# Patient Record
Sex: Male | Born: 1990 | Hispanic: No | Marital: Single | Smoking: Former smoker
Health system: Southern US, Community
[De-identification: ages and names within clinical notes are randomized; demographics above are authoritative.]

## PROBLEM LIST (undated history)

## (undated) DIAGNOSIS — R000 Tachycardia, unspecified: Secondary | ICD-10-CM

---

## 2012-03-05 ENCOUNTER — Other Ambulatory Visit (HOSPITAL_COMMUNITY): Payer: Self-pay | Admitting: Internal Medicine

## 2012-03-05 ENCOUNTER — Ambulatory Visit (HOSPITAL_COMMUNITY)
Admission: RE | Admit: 2012-03-05 | Discharge: 2012-03-05 | Disposition: A | Discharge status: Home / Self Care | Payer: Medicaid Other | Source: Ambulatory Visit | Attending: Internal Medicine | Admitting: Internal Medicine

## 2012-03-05 DIAGNOSIS — R52 Pain, unspecified: Secondary | ICD-10-CM

## 2012-03-05 DIAGNOSIS — R079 Chest pain, unspecified: Secondary | ICD-10-CM | POA: Insufficient documentation

## 2012-03-05 DIAGNOSIS — M954 Acquired deformity of chest and rib: Secondary | ICD-10-CM | POA: Insufficient documentation

## 2016-02-21 ENCOUNTER — Ambulatory Visit (HOSPITAL_COMMUNITY)
Admission: EM | Admit: 2016-02-21 | Discharge: 2016-02-21 | Disposition: A | Discharge status: Home / Self Care | Payer: Medicaid Other | Attending: Family Medicine | Admitting: Family Medicine

## 2016-02-21 ENCOUNTER — Encounter (HOSPITAL_COMMUNITY): Payer: Self-pay | Admitting: Emergency Medicine

## 2016-02-21 DIAGNOSIS — S76211A Strain of adductor muscle, fascia and tendon of right thigh, initial encounter: Secondary | ICD-10-CM

## 2016-02-21 HISTORY — DX: Tachycardia, unspecified: R00.0

## 2016-02-21 NOTE — ED Triage Notes (Signed)
Pt has been having some right groin pain for a few days.  Today he noticed some swelling in his right upper leg in the groin area.  He states he does a lot of lifting for work.  Pt reports a low grade fever of 100 for the last month.  He also reports constipation that is relieved with OTC stool softener.

## 2016-02-21 NOTE — ED Provider Notes (Signed)
[facility]    CSN: [id number] Arrival date & time: 02/21/16  1352     History   Chief Complaint Chief Complaint  Patient presents with  . Groin Pain    HPI Gregory Calderon is a 26 y.o. male.   The history is provided by the patient.  Groin Pain  This is a new problem. The current episode started 2 days ago. The problem has not changed since onset.Pertinent negatives include no chest pain and no abdominal pain.    Past Medical History:  Diagnosis Date  . Tachycardia     There are no active problems to display for this patient.   History reviewed. No pertinent surgical history.     Home Medications    Prior to Admission medications   Not on File    Family History History reviewed. No pertinent family history.  Social History Social History  Substance Use Topics  . Smoking status: Current Some Day Smoker  . Smokeless tobacco: Never Used  . Alcohol use No     Allergies   Patient has no known allergies.   Review of Systems Review of Systems  Constitutional: Negative.   Cardiovascular: Negative for chest pain.  Gastrointestinal: Positive for constipation and rectal pain. Negative for abdominal distention, abdominal pain, nausea and vomiting.  Genitourinary: Negative.   Musculoskeletal: Negative.   All other systems reviewed and are negative.    Physical Exam Triage Vital Signs ED Triage Vitals [02/21/16 1508]  Enc Vitals Group     BP 129/78     Pulse Rate 79     Resp      Temp 98.2 F (36.8 C)     Temp Source Oral     SpO2 100 %     Weight      Height      Head Circumference      Peak Flow      Pain Score 1     Pain Loc      Pain Edu?      Excl. in GC?    No data found.   Updated Vital Signs BP 129/78 (BP Location: Left Arm)   Pulse 79   Temp 98.2 F (36.8 C) (Oral)   SpO2 100%   Visual Acuity Right Eye Distance:   Left Eye Distance:   Bilateral Distance:    Right Eye Near:   Left Eye Near:    Bilateral  Near:     Physical Exam  Constitutional: He appears well-developed and well-nourished. No distress.  Abdominal: Soft. Bowel sounds are normal. He exhibits no distension and no mass. There is no tenderness. There is no rebound and no guarding. No hernia.    Skin: Skin is warm.  Nursing note and vitals reviewed.    UC Treatments / Results  Labs (all labs ordered are listed, but only abnormal results are displayed) Labs Reviewed - No data to display  EKG  EKG Interpretation None       Radiology No results found.  Procedures Procedures (including critical care time)  Medications Ordered in UC Medications - No data to display   Initial Impression / Assessment and Plan / UC Course  I have reviewed the triage vital signs and the nursing notes.  Pertinent labs & imaging results that were available during my care of the patient were reviewed by me and considered in my medical decision making (see chart for details).       Final Clinical Impressions(s) / UC Diagnoses  Final diagnoses:  Groin strain, right, initial encounter    New Prescriptions There are no discharge medications for this patient.    Linna HoffJames D Kindl, MD 02/21/16 [phone]

## 2016-02-21 NOTE — Discharge Instructions (Signed)
Heat and advil for soreness as needed

## 2016-06-07 ENCOUNTER — Ambulatory Visit (INDEPENDENT_AMBULATORY_CARE_PROVIDER_SITE_OTHER): Payer: Self-pay

## 2016-06-07 ENCOUNTER — Encounter (HOSPITAL_COMMUNITY): Payer: Self-pay | Admitting: Emergency Medicine

## 2016-06-07 ENCOUNTER — Ambulatory Visit (HOSPITAL_COMMUNITY)
Admission: EM | Admit: 2016-06-07 | Discharge: 2016-06-07 | Disposition: A | Discharge status: Home / Self Care | Payer: Self-pay | Attending: Internal Medicine | Admitting: Internal Medicine

## 2016-06-07 DIAGNOSIS — S90222A Contusion of left lesser toe(s) with damage to nail, initial encounter: Secondary | ICD-10-CM

## 2016-06-07 DIAGNOSIS — S9030XA Contusion of unspecified foot, initial encounter: Secondary | ICD-10-CM

## 2016-06-07 DIAGNOSIS — S82402A Unspecified fracture of shaft of left fibula, initial encounter for closed fracture: Secondary | ICD-10-CM

## 2016-06-07 MED ORDER — HYDROCODONE-ACETAMINOPHEN 5-325 MG PO TABS: 2.0000 | ORAL_TABLET | ORAL | 0 refills | Status: DC | PRN

## 2016-06-07 MED ORDER — HYDROCODONE-ACETAMINOPHEN 5-325 MG PO TABS: 1.0000 | ORAL_TABLET | ORAL | 0 refills | Status: DC | PRN

## 2016-06-07 NOTE — ED Triage Notes (Signed)
Patient reports a large mirror landed on left foot and left great toe, toenail is discolored and heard a cracking sound and felt something on top of foot at the ankle.  Incident occurred today.  Left pedal pulse 2+

## 2016-06-07 NOTE — ED Notes (Signed)
Med  Cam   Walker  applied

## 2016-06-07 NOTE — ED Provider Notes (Signed)
CSN: [id number]     Arrival date & time 06/07/16  1555 History   None    Chief Complaint  Patient presents with  . Foot Pain   (Consider location/radiation/quality/duration/timing/severity/associated sxs/prior Treatment) Patient c/o injury to left foot and left leg when dropped a large mirror on his left lower extremity.    The history is provided by the patient.  Foot Pain  This is a new problem. The current episode started 6 to 12 hours ago. The problem occurs constantly. The problem has not changed since onset.Nothing aggravates the symptoms.    Past Medical History:  Diagnosis Date  . Tachycardia    History reviewed. No pertinent surgical history. History reviewed. No pertinent family history. Social History  Substance Use Topics  . Smoking status: Former Games developer  . Smokeless tobacco: Never Used  . Alcohol use No    Review of Systems  Constitutional: Negative.   HENT: Negative.   Eyes: Negative.   Respiratory: Negative.   Cardiovascular: Negative.   Gastrointestinal: Negative.   Endocrine: Negative.   Genitourinary: Negative.   Musculoskeletal: Negative.   Allergic/Immunologic: Negative.   Neurological: Negative.   Hematological: Negative.   Psychiatric/Behavioral: Negative.     Allergies  Patient has no known allergies.  Home Medications   Prior to Admission medications   Medication Sig Start Date End Date Taking? Authorizing Provider  ibuprofen (ADVIL,MOTRIN) 800 MG tablet Take 800 mg by mouth every 8 (eight) hours as needed.   Yes [provider]   Meds Ordered and Administered this Visit  Medications - No data to display  BP 122/77 (BP Location: Left Arm)   Pulse 75   Temp 98.1 F (36.7 C) (Oral)   Resp 18   SpO2 99%  No data found.   Physical Exam  Constitutional: He is oriented to person, place, and time. He appears well-developed and well-nourished.  HENT:  Head: Normocephalic and atraumatic.  Eyes: Conjunctivae and EOM are  normal. Pupils are equal, round, and reactive to light.  Neck: Normal range of motion. Neck supple.  Cardiovascular: Normal rate, regular rhythm and normal heart sounds.   Pulmonary/Chest: Effort normal and breath sounds normal.  Abdominal: Soft. Bowel sounds are normal.  Musculoskeletal: He exhibits tenderness.  Left first toe with subungual hematoma nad bruising over left first toe.  Bruising left second and third toe.  TTP left lateral ankle.  Neurological: He is alert and oriented to person, place, and time.  Nursing note and vitals reviewed.   Urgent Care Course     Procedures (including critical care time)  Labs Review Labs Reviewed - No data to display  Imaging Review Dg Ankle Complete Left  Result Date: 06/07/2016 CLINICAL DATA:  Dropped mirror on left foot and ankle EXAM: LEFT ANKLE COMPLETE - 3+ VIEW COMPARISON:  06/07/2016 FINDINGS: No fracture or dislocation at the left ankle. The mortise is symmetric. Pointed appearance of the cortex at the mid to distal shaft of the fibula on one view, there appears to be overlying soft tissue swelling. IMPRESSION: 1. No fracture or dislocation at the ankle 2. Pointed appearance of the cortex of the mid to distal lateral shaft of the fibula on single view, uncertain if this represents a cortical fracture. Correlate clinically for focal tenderness to this region. Could obtain dedicated tibia and fibula views for further evaluation. Electronically Signed   By: Jasmine Pang M.D.   On: 06/07/2016 16:39   Dg Foot Complete Left  Result Date: 06/07/2016 CLINICAL DATA:  Dropped heavy weight on foot with pain, initial encounter EXAM: LEFT FOOT - COMPLETE 3+ VIEW COMPARISON:  None. FINDINGS: There is no evidence of fracture or dislocation. There is no evidence of arthropathy or other focal bone abnormality. Soft tissues are unremarkable. IMPRESSION: No acute abnormality noted. Electronically Signed   By: Alcide CleverMark  Lukens M.D.   On: 06/07/2016 16:29      Visual Acuity Review  Right Eye Distance:   Left Eye Distance:   Bilateral Distance:    Right Eye Near:   Left Eye Near:    Bilateral Near:         MDM   1. Contusion of foot, unspecified laterality, initial encounter   2. Subungual hematoma of toe of left foot, initial encounter   3. Closed fracture of shaft of left fibula, unspecified fracture morphology, initial encounter    Cam Walker Referral to Sports Medicine Motrin 800mg  one po tid x 7 days ok to take from home. norco 5/325 one po q 6 hours prn #6     Deatra CanterOxford, William J, FNP 06/07/16 1723    Deatra Canterxford, William J, FNP 06/07/16 [phone]

## 2016-06-08 ENCOUNTER — Ambulatory Visit (INDEPENDENT_AMBULATORY_CARE_PROVIDER_SITE_OTHER): Payer: Self-pay

## 2016-06-08 ENCOUNTER — Ambulatory Visit (INDEPENDENT_AMBULATORY_CARE_PROVIDER_SITE_OTHER): Payer: Self-pay | Admitting: Family

## 2016-06-08 DIAGNOSIS — M25572 Pain in left ankle and joints of left foot: Secondary | ICD-10-CM

## 2016-06-08 MED ORDER — HYDROCODONE-ACETAMINOPHEN 5-325 MG PO TABS: 1.0000 | ORAL_TABLET | Freq: Four times a day (QID) | ORAL | 0 refills | Status: DC | PRN

## 2016-06-08 NOTE — Progress Notes (Signed)
Office Visit Note   Patient: Gregory Calderon           Date of Birth: 06/10/1990           MRN: [id number] Visit Date: 06/08/2016              Requested by: No referring provider defined for this encounter. PCP: Patient, No Pcp Per  No chief complaint on file.     HPI: The patient is a 26 year old gentleman who presents today complaining of left great toe foot and lateral ankle pain. Yesterday he dropped a heavy mirror on his foot. 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 Has had pain with weightbearing. Complains of swelling and discoloration to his left great toe.  Assessment & Plan: Visit Diagnoses:  1. Pain in left ankle and joints of left foot     Plan: Continue the fracture boot. He will be nonweightbearing for 2 weeks. Discussed that at 2 weeks he will likely the full weightbearing in a fracture boot. States will be moving out of state and will not be following up with us. May call with any questions.  Follow-Up Instructions: Return in about 2 weeks (around 06/22/2016).   Ortho Exam  Patient is alert, oriented, no adenopathy, well-dressed, normal affect, normal respiratory effort. Left great toe with some ungual hematoma this is the 100% of the nail bed. Exquisitely tender. Does have tenderness and some moderate swelling to the remainder of the great toe. There is no open ulcer does have flexion and extension of the great toe. The shaft of fibula is tender at the malleolus is nontender. Ankle ligaments nontender. Free range of motion of the ankle.  Imaging: Dg Ankle Complete Left  Result Date: 06/07/2016 CLINICAL DATA:  Dropped mirror on left foot and ankle EXAM: LEFT ANKLE COMPLETE - 3+ VIEW COMPARISON:  06/07/2016 FINDINGS: No fracture or dislocation at the left ankle. The mortise is symmetric. Pointed appearance of the cortex at the mid to distal shaft of the fibula on one view, there appears to be overlying soft tissue swelling. IMPRESSION: 1. No fracture or dislocation at the ankle 2.  Pointed appearance of the cortex of the mid to distal lateral shaft of the fibula on single view, uncertain if this represents a cortical fracture. Correlate clinically for focal tenderness to this region. Could obtain dedicated tibia and fibula views for further evaluation. Electronically Signed   By: Jasmine PangKim  Fujinaga M.D.   On: 06/07/2016 16:39   Dg Foot Complete Left  Result Date: 06/07/2016 CLINICAL DATA:  Dropped heavy weight on foot with pain, initial encounter EXAM: LEFT FOOT - COMPLETE 3+ VIEW COMPARISON:  None. FINDINGS: There is no evidence of fracture or dislocation. There is no evidence of arthropathy or other focal bone abnormality. Soft tissues are unremarkable. IMPRESSION: No acute abnormality noted. Electronically Signed   By: Alcide CleverMark  Lukens M.D.   On: 06/07/2016 16:29    Labs: No results found for: HGBA1C, ESRSEDRATE, CRP, LABURIC, REPTSTATUS, GRAMSTAIN, CULT, LABORGA  Orders:  Orders Placed This Encounter  Procedures  . XR Tibia/Fibula Left   No orders of the defined types were placed in this encounter.    Procedures: No procedures performed  Clinical Data: No additional findings.  ROS:  All other systems negative, except as noted in the HPI. Review of Systems  Constitutional: Negative for chills and fever.  Musculoskeletal: Positive for arthralgias and joint swelling.  Skin: Negative for wound.    Objective: Vital Signs: There were no vitals taken  for this visit.  Specialty Comments:  No specialty comments available.  PMFS History: There are no active problems to display for this patient.  Past Medical History:  Diagnosis Date  . Tachycardia     No family history on file.  No past surgical history on file. Social History   Occupational History  . Not on file.   Social History Main Topics  . Smoking status: Former Games developer  . Smokeless tobacco: Never Used  . Alcohol use No  . Drug use: No  . Sexual activity: Not on file

## 2016-06-14 ENCOUNTER — Telehealth (INDEPENDENT_AMBULATORY_CARE_PROVIDER_SITE_OTHER): Payer: Self-pay | Admitting: Radiology

## 2016-06-14 NOTE — Telephone Encounter (Signed)
Patient calling states his ankle is no better. He feels like the swelling is worse, he feels nerve pain now. He states the fracture boot makes his swelling worse. I advised swelling and pain are not uncommon from injury/trauma that he had to his foot. That to continue with elevation, wear ace bandage for compression. He believes the compression is causing his swelling to worsen. Offered him appointment time, he would like to see physician only. Made appointment tomorrow for 830 am with MD. Per Elnita Maxwellheryl, patient is self pay no charge in co-payment because this is a follow up from the fracture.

## 2016-06-15 ENCOUNTER — Ambulatory Visit (INDEPENDENT_AMBULATORY_CARE_PROVIDER_SITE_OTHER): Payer: Self-pay | Admitting: Orthopedic Surgery

## 2016-06-15 ENCOUNTER — Encounter (INDEPENDENT_AMBULATORY_CARE_PROVIDER_SITE_OTHER): Payer: Self-pay | Admitting: Orthopedic Surgery

## 2016-06-15 DIAGNOSIS — S90222D Contusion of left lesser toe(s) with damage to nail, subsequent encounter: Secondary | ICD-10-CM

## 2016-06-15 DIAGNOSIS — M25572 Pain in left ankle and joints of left foot: Secondary | ICD-10-CM

## 2016-06-15 DIAGNOSIS — S90222A Contusion of left lesser toe(s) with damage to nail, initial encounter: Secondary | ICD-10-CM | POA: Insufficient documentation

## 2016-06-15 NOTE — Progress Notes (Signed)
   Office Visit Note   Patient: Gregory Calderon           Date of Birth: 04/13/1990           MRN: [id number] Visit Date: 06/15/2016              Requested by: No referring provider defined for this encounter. PCP: Patient, No Pcp Per  Chief Complaint  Patient presents with  . Left Ankle - Pain    Left fibula fracture      HPI: Patient presents in follow-up he states he dropped a meter on his leg and foot. Radiographs were obtained he was seen in the office and is seen today in follow-up. Patient states he has some pain in the forefoot which radiates up his leg. He complains of swelling. He states that compression is painful the fracture boot is painful.  Assessment & Plan: Visit Diagnoses:  1. Pain in left ankle and joints of left foot   2. Subungual hematoma of left foot, subsequent encounter     Plan: Recommended a stiff soled walking shoe recommended warm soaks for the foot. Patient previously refused a removal of the nail and decompression of the subungual hematoma. He will soak this in soapy water use antibiotic ointment and a Band-Aid. Stiff soled walking shoes increase his activities as tolerated. Patient does not have a fracture of the fibula. No fracture boot or crutches needed.  Follow-Up Instructions: Return if symptoms worsen or fail to improve.   Ortho Exam  Patient is alert, oriented, no adenopathy, well-dressed, normal affect, normal respiratory effort. Examination patient has no tenderness to palpation along the fibula or tibia. The ankle is nontender to palpation. He has a stable subungual hematoma of the left foot with no signs of infection. His radiographs were reviewed and the reported fracture of the fibula actually is a nutrient foramen. There is no sign of fracture of the great toe.  Imaging: No results found.  Labs: No results found for: HGBA1C, ESRSEDRATE, CRP, LABURIC, REPTSTATUS, GRAMSTAIN, CULT, LABORGA  Orders:  No orders of the defined types were  placed in this encounter.  No orders of the defined types were placed in this encounter.    Procedures: No procedures performed  Clinical Data: No additional findings.  ROS:  All other systems negative, except as noted in the HPI. Review of Systems  Objective: Vital Signs: There were no vitals taken for this visit.  Specialty Comments:  No specialty comments available.  PMFS History: Patient Active Problem List   Diagnosis Date Noted  . Subungual hematoma of left foot 06/15/2016   Past Medical History:  Diagnosis Date  . Tachycardia     History reviewed. No pertinent family history.  History reviewed. No pertinent surgical history. Social History   Occupational History  . Not on file.   Social History Main Topics  . Smoking status: Former Games developermoker  . Smokeless tobacco: Never Used  . Alcohol use No  . Drug use: No  . Sexual activity: Not on file

## 2017-04-12 ENCOUNTER — Ambulatory Visit (INDEPENDENT_AMBULATORY_CARE_PROVIDER_SITE_OTHER): Payer: BLUE CROSS/BLUE SHIELD

## 2017-04-12 ENCOUNTER — Ambulatory Visit: Payer: BLUE CROSS/BLUE SHIELD | Admitting: Podiatry

## 2017-04-12 VITALS — BP 119/81 | HR 94 | Ht 71.0 in | Wt 134.0 lb

## 2017-04-12 DIAGNOSIS — S82892S Other fracture of left lower leg, sequela: Secondary | ICD-10-CM | POA: Diagnosis not present

## 2017-04-12 DIAGNOSIS — M24472 Recurrent dislocation, left ankle: Secondary | ICD-10-CM | POA: Diagnosis not present

## 2017-04-12 DIAGNOSIS — G8929 Other chronic pain: Secondary | ICD-10-CM | POA: Diagnosis not present

## 2017-04-12 DIAGNOSIS — M958 Other specified acquired deformities of musculoskeletal system: Secondary | ICD-10-CM | POA: Diagnosis not present

## 2017-04-12 DIAGNOSIS — M79672 Pain in left foot: Secondary | ICD-10-CM

## 2017-04-12 DIAGNOSIS — S93492S Sprain of other ligament of left ankle, sequela: Secondary | ICD-10-CM

## 2017-04-12 DIAGNOSIS — S93492A Sprain of other ligament of left ankle, initial encounter: Secondary | ICD-10-CM

## 2017-04-12 DIAGNOSIS — M25572 Pain in left ankle and joints of left foot: Secondary | ICD-10-CM

## 2017-04-16 NOTE — Progress Notes (Signed)
  Subjective:  Patient ID: Gregory Calderon, male    DOB: 04/23/1990,  MRN: [id number]  Chief Complaint  Patient presents with  . Toe Injury    1 year ago dropped very heavy furniture on his Left  foot - fractured ankle, lost great toenail and now grows in weird and feels ingrown   . Foot Pain    foot and ankle pain chronic since injury. Pain and swelling around ankle and across top of foot   27 y.o. male presents with the above complaint.  Reports chronic injury to the left ankle 1 year ago.  States that he dropped very heavy furniture on his left foot, fractured his L ankle and lost a great toenail.  States that the nail grows in a weird position.  Still reports pain at the outside of the ankle.  Reports sensations of popping in the left ankle.  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  Has tried immobilization, ice, anti-inflammatory medicine, seen other providers for this issue.  Past Medical History:  Diagnosis Date  . Tachycardia    No past surgical history on file.  Current Outpatient Medications:  .  HYDROcodone-acetaminophen (NORCO/VICODIN) 5-325 MG tablet, Take 1 tablet by mouth every 6 (six) hours as needed. (Patient not taking: Reported on 04/12/2017), Disp: 30 tablet, Rfl: 0 .  ibuprofen (ADVIL,MOTRIN) 800 MG tablet, Take 800 mg by mouth every 8 (eight) hours as needed., Disp: , Rfl:   No Known Allergies Review of Systems Objective:   Vitals:   04/12/17 1411  BP: 119/81  Pulse: 94   General AA&O x3. Normal mood and affect.  Vascular Dorsalis pedis and posterior tibial pulses  present 2+ bilaterally  Capillary refill normal to all digits. Pedal hair growth normal.  Neurologic Epicritic sensation grossly present.  Dermatologic No open lesions. Interspaces clear of maceration. Nails well groomed and normal in appearance. L hallux nail thickening noted.  Orthopedic: MMT 5/5 in dorsiflexion, plantarflexion, inversion, and eversion. Normal joint ROM without  pain or crepitus. Pain with patient about the left ATFL, peroneal tendons, CFL. Negative anterior drawer. No pain to palpation about the deltoids. Increased peroneal excursion anteriorly but without evidence of full subluxation.   Assessment & Plan:  Patient was evaluated and treated and all questions answered.  L Ankle Fracture with concern for continued ankle instability -X-rays taken and reviewed.  Cortical irregularity along the midshaft of the  fibula, medial gutter osteophytes noted -MRI L ankle ordered to further eval ankle ligaments, peroneal tendons, fibula. Rule out OCD. -Patient would possibly benefit from surgical ankle arthroscopy pending results of MRI. -F/u in 2 weeks to review MRI.  Return in about 2 weeks (around 04/26/2017) for f/u MRI, L Ankle Pain.

## 2017-04-17 ENCOUNTER — Telehealth: Payer: Self-pay | Admitting: *Deleted

## 2017-04-17 DIAGNOSIS — S82892S Other fracture of left lower leg, sequela: Secondary | ICD-10-CM

## 2017-04-17 DIAGNOSIS — G8929 Other chronic pain: Secondary | ICD-10-CM

## 2017-04-17 DIAGNOSIS — M25572 Pain in left ankle and joints of left foot: Secondary | ICD-10-CM

## 2017-04-17 DIAGNOSIS — M958 Other specified acquired deformities of musculoskeletal system: Secondary | ICD-10-CM

## 2017-04-17 DIAGNOSIS — S93492S Sprain of other ligament of left ankle, sequela: Secondary | ICD-10-CM

## 2017-04-17 DIAGNOSIS — M24472 Recurrent dislocation, left ankle: Secondary | ICD-10-CM

## 2017-04-17 NOTE — Telephone Encounter (Signed)
-----   Message from Park LiterMichael J Price, DPM sent at 04/16/2017 12:40 PM EDT ----- Regarding: MRI L ankle Can we order MRI of this patient's ankle? See Dx codes. Note done

## 2017-04-17 NOTE — Telephone Encounter (Signed)
Orders to J. Quintana, RN for pre-cert, faxed to [facility]. 

## 2017-04-18 ENCOUNTER — Telehealth: Payer: Self-pay | Admitting: *Deleted

## 2017-04-18 NOTE — Telephone Encounter (Signed)
"  I'm calling regarding Gregory Calderon.  He's scheduled to have a left ankle MRI without contrast on Tuesday, April 2.  [facility]  If you could give me a call back with that authorization."

## 2017-04-23 ENCOUNTER — Ambulatory Visit
Admission: RE | Admit: 2017-04-23 | Discharge: 2017-04-23 | Disposition: A | Discharge status: Home / Self Care | Payer: BLUE CROSS/BLUE SHIELD | Source: Ambulatory Visit | Attending: Podiatry | Admitting: Podiatry

## 2017-04-26 ENCOUNTER — Ambulatory Visit: Payer: BLUE CROSS/BLUE SHIELD | Admitting: Podiatry

## 2017-04-26 DIAGNOSIS — S93492S Sprain of other ligament of left ankle, sequela: Secondary | ICD-10-CM

## 2017-04-26 DIAGNOSIS — M24472 Recurrent dislocation, left ankle: Secondary | ICD-10-CM

## 2017-04-26 DIAGNOSIS — M25572 Pain in left ankle and joints of left foot: Secondary | ICD-10-CM

## 2017-04-26 DIAGNOSIS — G8929 Other chronic pain: Secondary | ICD-10-CM | POA: Diagnosis not present

## 2017-04-29 ENCOUNTER — Telehealth: Payer: Self-pay | Admitting: *Deleted

## 2017-04-29 DIAGNOSIS — M779 Enthesopathy, unspecified: Secondary | ICD-10-CM

## 2017-04-29 NOTE — Telephone Encounter (Signed)
[facility] and demographics on hold for LOV clinicals from Dr. Samuella CotaPrice. Hand delivered [facility] - In-office referral.

## 2017-05-02 NOTE — Telephone Encounter (Signed)
Note done. Please sent PT referral to [facility]

## 2017-05-02 NOTE — Progress Notes (Signed)
  Subjective:  Patient ID: Gregory Calderon, male    DOB: 09/04/1990,  MRN: [id number]  No chief complaint on file.  27 y.o. male returns for the above complaint. Still having pain in the L ankle. States the brace does help him.  Had MRI performed and here for review  Objective:   General AA&O x3. Normal mood and affect.  Vascular Dorsalis pedis and posterior tibial pulses  present 2+ bilaterally  Capillary refill normal to all digits. Pedal hair growth normal.  Neurologic Epicritic sensation grossly present.  Dermatologic No open lesions. Interspaces clear of maceration. Nails well groomed and normal in appearance. L hallux nail thickening noted.  Orthopedic: MMT 5/5 in dorsiflexion, plantarflexion, inversion, and eversion. Normal joint ROM without pain or crepitus. Pain with patient about the left ATFL, peroneal tendons, CFL. Negative anterior drawer. No pain to palpation about the deltoids. Increased peroneal excursion anteriorly but without evidence of full subluxation.   Assessment & Plan:  Patient was evaluated and treated and all questions answered.  ATFL Insufficiency, Peroneal Tendonitis -MRI reviewed.  Results discussed at length.  Ankle ligaments and tendons appear to be intact however patient still has pain and inflammation and feelings of instability/catching.  Will refer for physical therapy for strengthening, balance and proprioceptive exercises -Would consider agnostic injection possible ankle arthroscopy in the future should symptoms persist  15 minutes of face to face time were spent with the patient. >50% of this was spent on counseling and coordination of care. Specifically discussed with patient the above diagnoses and overall treatment plan.   Return in about 6 weeks (around 06/07/2017) for Ankle Tendonitis f/u.

## 2017-05-02 NOTE — Telephone Encounter (Signed)
I spoke with Freda MunroMorgan - [facility] - In-office to cancel referral to their office. Lequita HaltMorgan states pt was seen in their office yesterday, and is scheduled to be seen again. I informed Dr. Samuella CotaPrice and he said fine.

## 2017-05-23 ENCOUNTER — Other Ambulatory Visit: Payer: Self-pay | Admitting: Podiatry

## 2017-05-23 DIAGNOSIS — S93492A Sprain of other ligament of left ankle, initial encounter: Secondary | ICD-10-CM

## 2017-06-07 ENCOUNTER — Ambulatory Visit: Payer: BLUE CROSS/BLUE SHIELD | Admitting: Podiatry

## 2017-06-28 ENCOUNTER — Ambulatory Visit: Payer: BLUE CROSS/BLUE SHIELD | Admitting: Podiatry

## 2017-09-17 ENCOUNTER — Encounter (HOSPITAL_COMMUNITY): Payer: Self-pay

## 2017-09-17 ENCOUNTER — Ambulatory Visit (HOSPITAL_COMMUNITY)
Admission: EM | Admit: 2017-09-17 | Discharge: 2017-09-17 | Disposition: A | Discharge status: Home / Self Care | Payer: BLUE CROSS/BLUE SHIELD | Attending: Family Medicine | Admitting: Family Medicine

## 2017-09-17 DIAGNOSIS — J0100 Acute maxillary sinusitis, unspecified: Secondary | ICD-10-CM | POA: Diagnosis not present

## 2017-09-17 DIAGNOSIS — H9202 Otalgia, left ear: Secondary | ICD-10-CM

## 2017-09-17 MED ORDER — CEFDINIR 300 MG PO CAPS: 300.0000 mg | ORAL_CAPSULE | Freq: Two times a day (BID) | ORAL | 0 refills | Status: DC

## 2017-09-17 MED ORDER — PREDNISONE 10 MG (21) PO TBPK: ORAL_TABLET | Freq: Every day | ORAL | 0 refills | Status: DC

## 2017-09-17 NOTE — ED Triage Notes (Signed)
Pt presents with sinus issues, ear pain in both ears and a sore throat.

## 2017-09-18 NOTE — ED Provider Notes (Signed)
[facility]   [id number] 09/17/17 Arrival Time: 1842  ASSESSMENT & PLAN:  1. Acute non-recurrent maxillary sinusitis   2. Otalgia, left     Meds ordered this encounter  Medications  . cefdinir (OMNICEF) 300 MG capsule    Sig: Take 1 capsule (300 mg total) by mouth 2 (two) times daily.    Dispense:  20 capsule    Refill:  0  . predniSONE (STERAPRED UNI-PAK 21 TAB) 10 MG (21) TBPK tablet    Sig: Take by mouth daily. Take as directed.    Dispense:  21 tablet    Refill:  0    Discussed typical duration of symptoms. OTC symptom care as needed. May f/u with PCP or here as needed.  Reviewed expectations re: course of current medical issues. Questions answered. Outlined signs and symptoms indicating need for more acute intervention. Patient verbalized understanding. After Visit Summary given.   SUBJECTIVE: History from: patient.  Gregory Calderon is a 27 y.o. male who presents with complaint of nasal congestion, post-nasal drainage, and sinus pain. Onset gradual, approximately 2-3 weeks ago. Respiratory symptoms: none Fever: no. Overall normal PO intake without n/v. OTC treatment: Zyrtec without much help. Also reports discomfort of both ears (L>R) without drainage. "Popping" on recent airline flight. No hearing changes. No neck pain or neck swelling. History of frequent sinus infections: no.  Social History   Tobacco Use  Smoking Status Former Smoker  Smokeless Tobacco Never Used    ROS: As per HPI.   OBJECTIVE:  Vitals:   09/17/17 1932  BP: 104/61  Pulse: 67  Resp: 20  Temp: 98.4 F (36.9 C)  TempSrc: Temporal  SpO2: 99%     General appearance: alert; appears fatigued HEENT: nasal congestion; clear runny nose; throat irritation secondary to post-nasal drainage; bilateral maxillary tenderness to palpation; turbinates boggy; TMs with slight clear fluid and mild bulging; no sign of otitis media Neck: supple without LAD Lungs: unlabored respirations,  symmetrical air entry; cough: absent; no respiratory distress Skin: warm and dry Psychological: alert and cooperative; normal mood and affect  No Known Allergies  Past Medical History:  Diagnosis Date  . Tachycardia    History reviewed. No pertinent family history. Social History   Socioeconomic History  . Marital status: Single    Spouse name: Not on file  . Number of children: Not on file  . Years of education: Not on file  . Highest education level: Not on file  Occupational History  . Not on file  Social Needs  . Financial resource strain: Not on file  . Food insecurity:    Worry: Not on file    Inability: Not on file  . Transportation needs:    Medical: Not on file    Non-medical: Not on file  Tobacco Use  . Smoking status: Former Games developer  . Smokeless tobacco: Never Used  Substance and Sexual Activity  . Alcohol use: No  . Drug use: No  . Sexual activity: Not on file  Lifestyle  . Physical activity:    Days per week: Not on file    Minutes per session: Not on file  . Stress: Not on file  Relationships  . Social connections:    Talks on phone: Not on file    Gets together: Not on file    Attends religious service: Not on file    Active member of club or organization: Not on file    Attends meetings of clubs or organizations: Not on  file    Relationship status: Not on file  . Intimate partner violence:    Fear of current or ex partner: Not on file    Emotionally abused: Not on file    Physically abused: Not on file    Forced sexual activity: Not on file  Other Topics Concern  . Not on file  Social History Narrative  . Not on file            Mardella LaymanHagler, Jeferson Boozer, MD 09/18/17 1021

## 2017-12-02 ENCOUNTER — Other Ambulatory Visit: Payer: Self-pay

## 2017-12-02 ENCOUNTER — Encounter (HOSPITAL_COMMUNITY): Payer: Self-pay | Admitting: Emergency Medicine

## 2017-12-02 ENCOUNTER — Telehealth (HOSPITAL_COMMUNITY): Payer: Self-pay | Admitting: Emergency Medicine

## 2017-12-02 ENCOUNTER — Ambulatory Visit (HOSPITAL_COMMUNITY)
Admission: EM | Admit: 2017-12-02 | Discharge: 2017-12-02 | Disposition: A | Discharge status: Home / Self Care | Payer: BLUE CROSS/BLUE SHIELD | Attending: Family Medicine | Admitting: Family Medicine

## 2017-12-02 DIAGNOSIS — J22 Unspecified acute lower respiratory infection: Secondary | ICD-10-CM | POA: Diagnosis not present

## 2017-12-02 DIAGNOSIS — H9202 Otalgia, left ear: Secondary | ICD-10-CM

## 2017-12-02 MED ORDER — PREDNISONE 20 MG PO TABS: 40.0000 mg | ORAL_TABLET | Freq: Every day | ORAL | 0 refills | Status: DC

## 2017-12-02 MED ORDER — FLUTICASONE PROPIONATE 50 MCG/ACT NA SUSP: 1.0000 | Freq: Every day | NASAL | 2 refills | Status: DC

## 2017-12-02 MED ORDER — PREDNISONE 20 MG PO TABS: 40.0000 mg | ORAL_TABLET | Freq: Every day | ORAL | 0 refills | Status: AC

## 2017-12-02 MED ORDER — AZITHROMYCIN 250 MG PO TABS: ORAL_TABLET | ORAL | 0 refills | Status: DC

## 2017-12-02 MED ORDER — FLUTICASONE PROPIONATE 50 MCG/ACT NA SUSP: 1.0000 | Freq: Every day | NASAL | 2 refills | Status: AC

## 2017-12-02 MED ORDER — AZITHROMYCIN 250 MG PO TABS: ORAL_TABLET | ORAL | 0 refills | Status: AC

## 2017-12-02 NOTE — ED Triage Notes (Signed)
The patient presented to the [facility] with a complaint of a cough and left ear pain that started during a flight from [place].

## 2017-12-02 NOTE — ED Provider Notes (Signed)
[facility]    CSN: [id number] Arrival date & time: 12/02/17  1304     History   Chief Complaint Chief Complaint  Patient presents with  . Cough    HPI Gregory Calderon is a 27 y.o. male.   Gregory Calderon presents with complaints of persistent cough, sore throat, left ear pain and bilateral ear congestion. Worse over the past week but has been dealing with symptoms for a few weeks now. States approximately 5 weeks ago was given a 20 day course of amoxicillin, from an urgent care in [place] where he was. Flew here yesterday and developed severe left ear pain which has persisted. Both ears "pop" when he swallows. Cough is productive. Has nasal congestion. Feels shortness of breath. Chest and rib pain from coughing. Has been taking allergy medication as well as cough syrup which have helped some. 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     ROS per HPI.      Past Medical History:  Diagnosis Date  . Tachycardia     Patient Active Problem List   Diagnosis Date Noted  . 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 No pertinent surgical history.     Home Medications    Prior to Admission medications   Medication Sig Start Date End Date Taking? Authorizing Provider  ibuprofen (ADVIL,MOTRIN) 800 MG tablet Take 800 mg by mouth every 8 (eight) hours as needed.   Yes [provider]  azithromycin (ZITHROMAX) 250 MG tablet Take 2 tablets (500 mg total) by mouth daily for 1 day, THEN 1 tablet (250 mg total) daily for 4 days. 12/02/17 12/07/17  Georgetta Haber, NP  fluticasone (FLONASE) 50 MCG/ACT nasal spray Place 1 spray into both nostrils daily. 12/02/17   Georgetta Haber, NP  predniSONE (DELTASONE) 20 MG tablet Take 2 tablets (40 mg total) by mouth daily with breakfast for 5 days. 12/02/17 12/07/17  Georgetta Haber, NP    Family History History reviewed. No pertinent family  history.  Social History Social History   Tobacco Use  . Smoking status: Former Games developer  . Smokeless tobacco: Never Used  Substance Use Topics  . Alcohol use: No  . Drug use: No     Allergies   Patient has no known allergies.   Review of Systems Review of Systems   Physical Exam Triage Vital Signs ED Triage Vitals  Enc Vitals Group     BP 12/02/17 1358 126/73     Pulse Rate 12/02/17 1358 82     Resp 12/02/17 1358 16     Temp 12/02/17 1358 98.9 F (37.2 C)     Temp Source 12/02/17 1358 Oral     SpO2 12/02/17 1358 100 %     Weight --      Height --      Head Circumference --      Peak Flow --      Pain Score 12/02/17 1357 4     Pain Loc --      Pain Edu? --      Excl. in GC? --    No data found.  Updated Vital Signs BP 126/73 (BP Location: Left Arm)   Pulse 82   Temp 98.9 F (37.2 C) (Oral)   Resp 16   SpO2 100%    Physical Exam  Constitutional: He is oriented to person, place, and time. He appears well-developed  and well-nourished.  HENT:  Head: Normocephalic and atraumatic.  Right Ear: Tympanic membrane, external ear and ear canal normal.  Left Ear: External ear and ear canal normal. A middle ear effusion is present.  Nose: Nose normal. Right sinus exhibits no maxillary sinus tenderness and no frontal sinus tenderness. Left sinus exhibits no maxillary sinus tenderness and no frontal sinus tenderness.  Mouth/Throat: Uvula is midline, oropharynx is clear and moist and mucous membranes are normal.  Slight effusion to left noted, no redness or bulging  Eyes: Pupils are equal, round, and reactive to light. Conjunctivae are normal.  Neck: Normal range of motion.  Cardiovascular: Normal rate and regular rhythm.  Pulmonary/Chest: Effort normal and breath sounds normal.  Lymphadenopathy:    He has no cervical adenopathy.  Neurological: He is alert and oriented to person, place, and time.  Skin: Skin is warm and dry.  Vitals reviewed.    UC Treatments /  Results  Labs (all labs ordered are listed, but only abnormal results are displayed) Labs Reviewed - No data to display  EKG None  Radiology No results found.  Procedures Procedures (including critical care time)  Medications Ordered in UC Medications - No data to display  Initial Impression / Assessment and Plan / UC Course  I have reviewed the triage vital signs and the nursing notes.  Pertinent labs & imaging results that were available during my care of the patient were reviewed by me and considered in my medical decision making (see chart for details).     Patient with complaints of acute on chronic (?) cough and congestion symptoms with ear pain. Cough worsening, causing shortness of breath  And pain. Lungs clear today, no increased work of breathing. A month ago was on amoxicillin. Discussed viral etiology. Course of prednisone provided. Opted to provide azithromycin to cover any atypicals. Encouraged close follow up with PCP. Return precautions provided. Patient verbalized understanding and agreeable to plan.   Final Clinical Impressions(s) / UC Diagnoses   Final diagnoses:  Lower respiratory tract infection  Otalgia of left ear     Discharge Instructions     Push fluids to ensure adequate hydration and keep secretions thin.  Tylenol and/or ibuprofen as needed for pain or fevers.  Complete course of antibiotics.  5 days of prednisone.  Continue with cough syrup and allergy treatment.  Please start with daily nasal spray.  If symptoms worsen or do not improve in the next week to return to be seen or to follow up with your PCP.     ED Prescriptions    Medication Sig Dispense Auth. Provider   predniSONE (DELTASONE) 20 MG tablet Take 2 tablets (40 mg total) by mouth daily with breakfast for 5 days. 10 tablet Linus Mako B, NP   azithromycin (ZITHROMAX) 250 MG tablet Take 2 tablets (500 mg total) by mouth daily for 1 day, THEN 1 tablet (250 mg total) daily for 4  days. 6 tablet Linus Mako B, NP   fluticasone (FLONASE) 50 MCG/ACT nasal spray Place 1 spray into both nostrils daily. 16 g Georgetta Haber, NP     Controlled Substance Prescriptions [place] Controlled Substance Registry consulted? Not Applicable   Georgetta Haber, NP 12/02/17 1523

## 2017-12-02 NOTE — Discharge Instructions (Signed)
Push fluids to ensure adequate hydration and keep secretions thin.  Tylenol and/or ibuprofen as needed for pain or fevers.  Complete course of antibiotics.  5 days of prednisone.  Continue with cough syrup and allergy treatment.  Please start with daily nasal spray.  If symptoms worsen or do not improve in the next week to return to be seen or to follow up with your PCP.

## 2020-08-08 ENCOUNTER — Emergency Department (HOSPITAL_COMMUNITY): Payer: 59

## 2020-08-08 ENCOUNTER — Emergency Department (HOSPITAL_COMMUNITY)
Admission: EM | Admit: 2020-08-08 | Discharge: 2020-08-08 | Disposition: A | Discharge status: Home / Self Care | Payer: 59 | Attending: Emergency Medicine | Admitting: Emergency Medicine

## 2020-08-08 ENCOUNTER — Other Ambulatory Visit: Payer: Self-pay

## 2020-08-08 DIAGNOSIS — M25532 Pain in left wrist: Secondary | ICD-10-CM

## 2020-08-08 DIAGNOSIS — R0789 Other chest pain: Secondary | ICD-10-CM | POA: Insufficient documentation

## 2020-08-08 DIAGNOSIS — Z87891 Personal history of nicotine dependence: Secondary | ICD-10-CM | POA: Diagnosis not present

## 2020-08-08 DIAGNOSIS — S61512A Laceration without foreign body of left wrist, initial encounter: Secondary | ICD-10-CM | POA: Diagnosis not present

## 2020-08-08 DIAGNOSIS — Y92410 Unspecified street and highway as the place of occurrence of the external cause: Secondary | ICD-10-CM | POA: Insufficient documentation

## 2020-08-08 DIAGNOSIS — S61412A Laceration without foreign body of left hand, initial encounter: Secondary | ICD-10-CM

## 2020-08-08 DIAGNOSIS — Z23 Encounter for immunization: Secondary | ICD-10-CM | POA: Diagnosis not present

## 2020-08-08 DIAGNOSIS — S6992XA Unspecified injury of left wrist, hand and finger(s), initial encounter: Secondary | ICD-10-CM | POA: Diagnosis present

## 2020-08-08 DIAGNOSIS — R519 Headache, unspecified: Secondary | ICD-10-CM | POA: Diagnosis not present

## 2020-08-08 DIAGNOSIS — M542 Cervicalgia: Secondary | ICD-10-CM | POA: Diagnosis not present

## 2020-08-08 MED ORDER — OXYCODONE-ACETAMINOPHEN 5-325 MG PO TABS
1.0000 | ORAL_TABLET | Freq: Once | ORAL | Status: AC
  Administered 2020-08-08: 1 via ORAL
  Filled 2020-08-08: qty 1

## 2020-08-08 MED ORDER — TETANUS-DIPHTH-ACELL PERTUSSIS 5-2.5-18.5 LF-MCG/0.5 IM SUSY
0.5000 mL | PREFILLED_SYRINGE | Freq: Once | INTRAMUSCULAR | Status: AC
  Administered 2020-08-08: 0.5 mL via INTRAMUSCULAR
  Filled 2020-08-08: qty 0.5
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
## 2020-08-08 NOTE — Discharge Instructions (Addendum)
Take Tylenol ibuprofen as needed for pain.  Giving you a wrist splint given the location of your pain.  Follow-up with orthopedics in a few weeks for repeat x-ray  Dermabond should peel off over time.  Keep that area clean  Return for new or worsening symptoms
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
## 2024-02-22 ENCOUNTER — Other Ambulatory Visit (HOSPITAL_COMMUNITY): Payer: Self-pay

## 2024-02-22 MED ORDER — DOXYCYCLINE HYCLATE 100 MG PO TABS
ORAL_TABLET | ORAL | 0 refills | Status: AC
  Filled 2024-02-22: qty 14, 7d supply, fill #0
